# Patient Record
Sex: Male | Born: 1975 | Race: Black or African American | Hispanic: No | Marital: Married | Smoking: Never smoker
Health system: Southern US, Community
[De-identification: ages and names within clinical notes are randomized; demographics above are authoritative.]

## PROBLEM LIST (undated history)

## (undated) DIAGNOSIS — R110 Nausea: Secondary | ICD-10-CM

## (undated) DIAGNOSIS — R42 Dizziness and giddiness: Secondary | ICD-10-CM

## (undated) DIAGNOSIS — E7800 Pure hypercholesterolemia, unspecified: Secondary | ICD-10-CM

## (undated) DIAGNOSIS — I10 Essential (primary) hypertension: Secondary | ICD-10-CM

## (undated) DIAGNOSIS — E119 Type 2 diabetes mellitus without complications: Secondary | ICD-10-CM

## (undated) HISTORY — DX: Dizziness and giddiness: R42

## (undated) HISTORY — DX: Nausea: R11.0

---

## 2008-01-03 ENCOUNTER — Emergency Department: Payer: Self-pay | Admitting: Internal Medicine

## 2011-02-18 ENCOUNTER — Emergency Department: Payer: Self-pay | Admitting: Emergency Medicine

## 2012-03-26 ENCOUNTER — Emergency Department: Payer: Self-pay | Admitting: Emergency Medicine

## 2012-03-26 LAB — COMPREHENSIVE METABOLIC PANEL
Albumin: 4.1 g/dL (ref 3.4–5.0)
Anion Gap: 9 (ref 7–16)
Calcium, Total: 8.6 mg/dL (ref 8.5–10.1)
Co2: 27 mmol/L (ref 21–32)
Creatinine: 0.95 mg/dL (ref 0.60–1.30)
EGFR (Non-African Amer.): >60
Glucose: 100 mg/dL — ABNORMAL HIGH (ref 65–99)
Potassium: 4.1 mmol/L (ref 3.5–5.1)
SGOT(AST): 30 U/L (ref 15–37)

## 2012-03-26 LAB — CBC
HGB: 13.8 g/dL (ref 13.0–18.0)
MCH: 30.6 pg (ref 26.0–34.0)
MCHC: 32.7 g/dL (ref 32.0–36.0)
RDW: 12.9 % (ref 11.5–14.5)

## 2012-03-26 LAB — CK TOTAL AND CKMB (NOT AT ARMC): CK-MB: 1.8 ng/mL (ref 0.5–3.6)

## 2013-08-10 ENCOUNTER — Emergency Department: Payer: Self-pay | Admitting: Emergency Medicine

## 2022-05-22 ENCOUNTER — Other Ambulatory Visit: Payer: Self-pay

## 2022-05-22 ENCOUNTER — Emergency Department (HOSPITAL_COMMUNITY): Payer: BC Managed Care – PPO

## 2022-05-22 ENCOUNTER — Encounter (HOSPITAL_COMMUNITY): Payer: Self-pay

## 2022-05-22 ENCOUNTER — Emergency Department (HOSPITAL_COMMUNITY)
Admission: EM | Admit: 2022-05-22 | Discharge: 2022-05-22 | Disposition: A | Discharge status: Home / Self Care | Payer: BC Managed Care – PPO | Attending: Emergency Medicine | Admitting: Emergency Medicine

## 2022-05-22 DIAGNOSIS — R2241 Localized swelling, mass and lump, right lower limb: Secondary | ICD-10-CM | POA: Diagnosis not present

## 2022-05-22 DIAGNOSIS — E119 Type 2 diabetes mellitus without complications: Secondary | ICD-10-CM | POA: Diagnosis not present

## 2022-05-22 DIAGNOSIS — I10 Essential (primary) hypertension: Secondary | ICD-10-CM | POA: Diagnosis not present

## 2022-05-22 DIAGNOSIS — M25561 Pain in right knee: Secondary | ICD-10-CM | POA: Diagnosis present

## 2022-05-22 HISTORY — DX: Pure hypercholesterolemia, unspecified: E78.00

## 2022-05-22 HISTORY — DX: Essential (primary) hypertension: I10

## 2022-05-22 HISTORY — DX: Type 2 diabetes mellitus without complications: E11.9

## 2022-05-22 MED ORDER — OXYCODONE-ACETAMINOPHEN 5-325 MG PO TABS
1.0000 | ORAL_TABLET | Freq: Once | ORAL | Status: AC
  Administered 2022-05-22: 1 via ORAL
  Filled 2022-05-22: qty 1

## 2022-05-22 MED ORDER — LIDOCAINE 5 % EX PTCH
1.0000 | MEDICATED_PATCH | CUTANEOUS | Status: DC
  Administered 2022-05-22: 1 via TRANSDERMAL
  Filled 2022-05-22: qty 1

## 2022-05-22 MED ORDER — NAPROXEN 500 MG PO TABS
500.0000 mg | ORAL_TABLET | Freq: Once | ORAL | Status: AC
  Administered 2022-05-22: 500 mg via ORAL
  Filled 2022-05-22: qty 1

## 2022-05-22 NOTE — ED Triage Notes (Signed)
Patient c/o right knee pain and swelling when he woke yesterday AM. Patient denies any injury.

## 2022-05-22 NOTE — Discharge Instructions (Addendum)
You were seen in the ER for evaluation of your right knee pain. Your x-ray showed arthritis, otherwise was unremarkable. You can take naproxen or ibuprofen 600mg  as needed for pain. You can also try OTC lidocaine patches as well to the area. Additionally, I have included the information on the RICE method as well.  I have included the information for a orthopedic provider to this discharge paperwork for you to follow up with. If you have any concern, new or worsening symptoms, please return to the ER for re-evaluation.   Contact a doctor if: The knee pain does not stop. The knee pain changes or gets worse. You have a fever along with knee pain. Your knee is red or feels warm when you touch it. Your knee gives out or locks up. Get help right away if: Your knee swells, and the swelling gets worse. You cannot move your knee. You have very bad knee pain that does not get better with pain medicine.

## 2022-05-22 NOTE — Progress Notes (Signed)
Orthopedic Tech Progress Note Patient Details:  Matthew Ford 1976-03-10 [id number]  Ortho Devices Type of Ortho Device: Crutches Ortho Device/Splint Interventions: Adjustment   Post Interventions Patient Tolerated: Well, Ambulated well Instructions Provided: Care of device, Adjustment of device, Poper ambulation with device  [name] 05/22/2022, 6:04 PM

## 2022-05-22 NOTE — ED Provider Notes (Signed)
[facility]-EMERGENCY DEPT Provider Note   CSN: [id number] Arrival date & time: 05/22/22  1348     History Chief Complaint  Patient presents with   Knee Pain    Prudence Davidson. is a 46 y.o. male HTN, DM, and HLD presents to the ED for evaluation right knee pain onset yesterday morning. The patient denies any trauma or injury to the area. He reports the pain is mainly at the top of his right knee and he has noticed some swelling. No pain medication taken for the pain. He denies any previous surgery or injury to the knee. Denies any numbness, tingling, or fever.    Knee Pain Associated symptoms: no fever       Home Medications Prior to Admission medications   Not on File      Allergies    Patient has no known allergies.    Review of Systems   Review of Systems  Constitutional:  Negative for chills and fever.  Respiratory:  Negative for shortness of breath.   Cardiovascular:  Negative for chest pain.  Musculoskeletal:  Positive for arthralgias and joint swelling.   Physical Exam Updated Vital Signs BP (!) 155/90 (BP Location: Left Arm)   Pulse 75   Temp 98.4 F (36.9 C) (Oral)   Resp 16   Ht 5\' 11"  (1.803 m)   Wt (!) 152.9 kg   SpO2 96%   BMI 47.00 kg/m  Physical Exam Constitutional:      Appearance: Normal appearance. He is obese.  Eyes:     General: No scleral icterus. Pulmonary:     Effort: Pulmonary effort is normal. No respiratory distress.  Musculoskeletal:        General: Tenderness present.     Comments: Right knee pain on ROM, but he is able to actively flex and extend the knee. Compartments are soft. Cap refill < 2seconds. Palpable DP and PT pulses. Sensation intact bilaterally. The patient does have some crepitus palpated superiorly upon flexing and extending the knee. Mild swelling noted to the upper aspect of the right knee. Pain is noted to the mid superior portion of the right knee. No erythema, increase in warmth, or  overlying skin changes noted. No tenderness in the popliteal region. No lower leg swelling. No palpable step off or deformities.   Skin:    General: Skin is dry.     Findings: No rash.  Neurological:     General: No focal deficit present.     Mental Status: He is alert. Mental status is at baseline.  Psychiatric:        Mood and Affect: Mood normal.    ED Results / Procedures / Treatments   Labs (all labs ordered are listed, but only abnormal results are displayed) Labs Reviewed - No data to display  EKG None  Radiology DG Knee Complete 4 Views Right  Result Date: 05/22/2022 CLINICAL DATA:  Right knee pain and swelling.  No known injury. EXAM: RIGHT KNEE - COMPLETE 4+ VIEW COMPARISON:  None Available. FINDINGS: Normal bone mineralization. No joint effusion. Mild chronic enthesopathic change at the quadriceps insertion on the patella. Joint spaces are preserved. No acute fracture is seen. No dislocation. IMPRESSION: 1. Mild enthesopathic change at the quadriceps insertion on the patella. 2. No significant osteoarthritis. Electronically Signed   By: 07/22/2022 M.D.   On: 05/22/2022 14:29    Procedures Procedures   Medications Ordered in ED Medications  lidocaine (LIDODERM) 5 % 1  patch (has no administration in time range)  naproxen (NAPROSYN) tablet 500 mg (has no administration in time range)    ED Course/ Medical Decision Making/ A&P                           Medical Decision Making Amount and/or Complexity of Data Reviewed Radiology: ordered.  Risk Prescription drug management.    46 year old male presents the emergency department for evaluation of right knee pain since yesterday.  Differential diagnosis includes was not limited to arthritis, MSK, sprain, strain, dislocation, septic arthritis, joint effusion.  Vital signs show mildly low blood pressure 135/90, afebrile, normal pulse rate, satting well room air without increased work of breathing.  Physical exam shows a  well-appearing male in no acute distress. Right knee pain on ROM, but he is able to actively flex and extend the knee. Compartments are soft. Cap refill < 2seconds. Palpable DP and PT pulses. Sensation intact bilaterally. The patient does have some crepitus palpated superiorly upon flexing and extending the knee. Mild swelling noted to the upper aspect. No erythema, increase in warmth, or overlying skin changes noted. No tenderness in the popliteal region. No lower leg swelling. No palpable step off or deformities.    X-ray shows mild enthesopathic change of the quadriceps insertion on the patella.  No significant osteoarthritis.  No injury or trauma to the area so a doubt any fracture. The patient is obese, so concern for arthritis, joint disease, or stress fracture. No fracture visualized on XR and the joint spaces are reportedly preserved. The patient is able to flex and extend his knee with some pain and his vitals are stable with no sign of fever or infection. Doubt septic arthritis at this time. There is no tappable fluid to remove.   Will give first does of naprosyn for his pain as well as a lidocaine patch and ACE bandage.  Patient was still having pain with weight-bearing so we did use crutches which the patient is ambulatory with. I also ordered him a Percocet as well.   Will give Orthopedic referral. Dicussed using topical lidocaine patches as well as taking naprosyn for pain. Reviewed the RICE method. We discussed strict return precautions and reg flag symptoms. The patient verbalized understanding and agrees to the plan. The patient is stable and being discharged home in good condition.   Final Clinical Impression(s) / ED Diagnoses Final diagnoses:  Acute pain of right knee    Rx / DC Orders ED Discharge Orders     None         Achille Rich, PA-C 05/22/22 1712    Wynetta Fines, MD 05/23/22 1047

## 2022-12-01 ENCOUNTER — Other Ambulatory Visit (HOSPITAL_COMMUNITY): Payer: Self-pay

## 2022-12-01 MED ORDER — OZEMPIC (2 MG/DOSE) 8 MG/3ML ~~LOC~~ SOPN
2.0000 mg | PEN_INJECTOR | SUBCUTANEOUS | 0 refills | Status: DC
  Filled 2022-12-01: qty 3, 28d supply, fill #0

## 2023-01-01 ENCOUNTER — Other Ambulatory Visit (HOSPITAL_COMMUNITY): Payer: Self-pay

## 2023-01-01 MED ORDER — OZEMPIC (2 MG/DOSE) 8 MG/3ML ~~LOC~~ SOPN
2.0000 mg | PEN_INJECTOR | SUBCUTANEOUS | 0 refills | Status: AC
Start: 2023-01-01 — End: ?
  Filled 2023-01-01: qty 3, 28d supply, fill #0

## 2023-01-08 ENCOUNTER — Other Ambulatory Visit (HOSPITAL_COMMUNITY): Payer: Self-pay

## 2023-01-08 MED ORDER — OZEMPIC (2 MG/DOSE) 8 MG/3ML ~~LOC~~ SOPN
2.0000 mg | PEN_INJECTOR | SUBCUTANEOUS | 0 refills | Status: AC
Start: 2023-01-05 — End: ?

## 2023-02-07 ENCOUNTER — Other Ambulatory Visit (HOSPITAL_COMMUNITY): Payer: Self-pay

## 2023-02-07 MED ORDER — OZEMPIC (2 MG/DOSE) 8 MG/3ML ~~LOC~~ SOPN
2.0000 mg | PEN_INJECTOR | SUBCUTANEOUS | 0 refills | Status: AC
Start: 2023-02-06 — End: ?
  Filled 2023-02-07: qty 3, 28d supply, fill #0

## 2023-02-14 ENCOUNTER — Other Ambulatory Visit (HOSPITAL_COMMUNITY): Payer: Self-pay

## 2023-03-08 ENCOUNTER — Other Ambulatory Visit (HOSPITAL_COMMUNITY): Payer: Self-pay

## 2023-03-08 MED ORDER — OZEMPIC (2 MG/DOSE) 8 MG/3ML ~~LOC~~ SOPN
2.0000 mg | PEN_INJECTOR | SUBCUTANEOUS | 0 refills | Status: AC
Start: 2023-03-08 — End: ?
  Filled 2023-03-08 – 2023-03-26 (×2): qty 3, 28d supply, fill #0

## 2023-03-23 ENCOUNTER — Other Ambulatory Visit (HOSPITAL_COMMUNITY): Payer: Self-pay

## 2023-03-26 ENCOUNTER — Other Ambulatory Visit (HOSPITAL_COMMUNITY): Payer: Self-pay

## 2023-04-09 ENCOUNTER — Other Ambulatory Visit (HOSPITAL_COMMUNITY): Payer: Self-pay

## 2023-04-09 MED ORDER — VITAMIN D (ERGOCALCIFEROL) 1.25 MG (50000 UNIT) PO CAPS
50000.0000 [IU] | ORAL_CAPSULE | ORAL | 1 refills | Status: AC
  Filled 2023-04-09: qty 13, 90d supply, fill #0

## 2023-04-09 MED ORDER — OZEMPIC (2 MG/DOSE) 8 MG/3ML ~~LOC~~ SOPN
2.0000 mg | PEN_INJECTOR | SUBCUTANEOUS | 0 refills | Status: AC
Start: 2023-04-09 — End: ?
  Filled 2023-04-09 – 2023-04-30 (×2): qty 3, 28d supply, fill #0

## 2023-04-10 ENCOUNTER — Other Ambulatory Visit: Payer: Self-pay

## 2023-04-10 ENCOUNTER — Other Ambulatory Visit (HOSPITAL_COMMUNITY): Payer: Self-pay

## 2023-04-13 ENCOUNTER — Other Ambulatory Visit (HOSPITAL_COMMUNITY): Payer: Self-pay

## 2023-04-30 ENCOUNTER — Other Ambulatory Visit (HOSPITAL_COMMUNITY): Payer: Self-pay

## 2023-05-15 ENCOUNTER — Other Ambulatory Visit (HOSPITAL_COMMUNITY): Payer: Self-pay

## 2023-05-15 MED ORDER — OZEMPIC (2 MG/DOSE) 8 MG/3ML ~~LOC~~ SOPN
2.0000 mg | PEN_INJECTOR | SUBCUTANEOUS | 0 refills | Status: AC
Start: 2023-05-15 — End: ?
  Filled 2023-05-15 – 2023-07-10 (×2): qty 3, 28d supply, fill #0

## 2023-06-18 ENCOUNTER — Other Ambulatory Visit (HOSPITAL_COMMUNITY): Payer: Self-pay

## 2023-06-18 MED ORDER — OZEMPIC (2 MG/DOSE) 8 MG/3ML ~~LOC~~ SOPN
2.0000 mg | PEN_INJECTOR | SUBCUTANEOUS | 0 refills | Status: AC
Start: 2023-06-16 — End: ?
  Filled 2023-06-18: qty 3, 28d supply, fill #0

## 2023-07-10 ENCOUNTER — Other Ambulatory Visit (HOSPITAL_COMMUNITY): Payer: Self-pay

## 2023-07-11 ENCOUNTER — Other Ambulatory Visit (HOSPITAL_COMMUNITY): Payer: Self-pay

## 2023-07-16 ENCOUNTER — Other Ambulatory Visit (HOSPITAL_COMMUNITY): Payer: Self-pay

## 2023-07-16 MED ORDER — MOUNJARO 2.5 MG/0.5ML ~~LOC~~ SOAJ
2.5000 mg | SUBCUTANEOUS | 0 refills | Status: DC
  Filled 2023-07-16: qty 2, 28d supply, fill #0

## 2023-07-30 ENCOUNTER — Other Ambulatory Visit (HOSPITAL_COMMUNITY): Payer: Self-pay

## 2023-08-13 ENCOUNTER — Other Ambulatory Visit (HOSPITAL_COMMUNITY): Payer: Self-pay

## 2023-08-13 MED ORDER — VITAMIN D (ERGOCALCIFEROL) 1.25 MG (50000 UNIT) PO CAPS
50000.0000 [IU] | ORAL_CAPSULE | ORAL | 3 refills | Status: AC
  Filled 2023-08-13 – 2023-08-31 (×2): qty 12, 84d supply, fill #0

## 2023-08-13 MED ORDER — MOUNJARO 5 MG/0.5ML ~~LOC~~ SOAJ
5.0000 mg | SUBCUTANEOUS | 0 refills | Status: AC
  Filled 2023-08-13 – 2023-09-14 (×2): qty 2, 28d supply, fill #0

## 2023-08-29 IMAGING — DX DG KNEE COMPLETE 4+V*R*
4 series · 4 of 4 positions shown · non-contrast
Comparison: None Available.

CLINICAL DATA: Right knee pain and swelling.  No known injury.

EXAM:
RIGHT KNEE - COMPLETE 4+ VIEW

[knee ap]
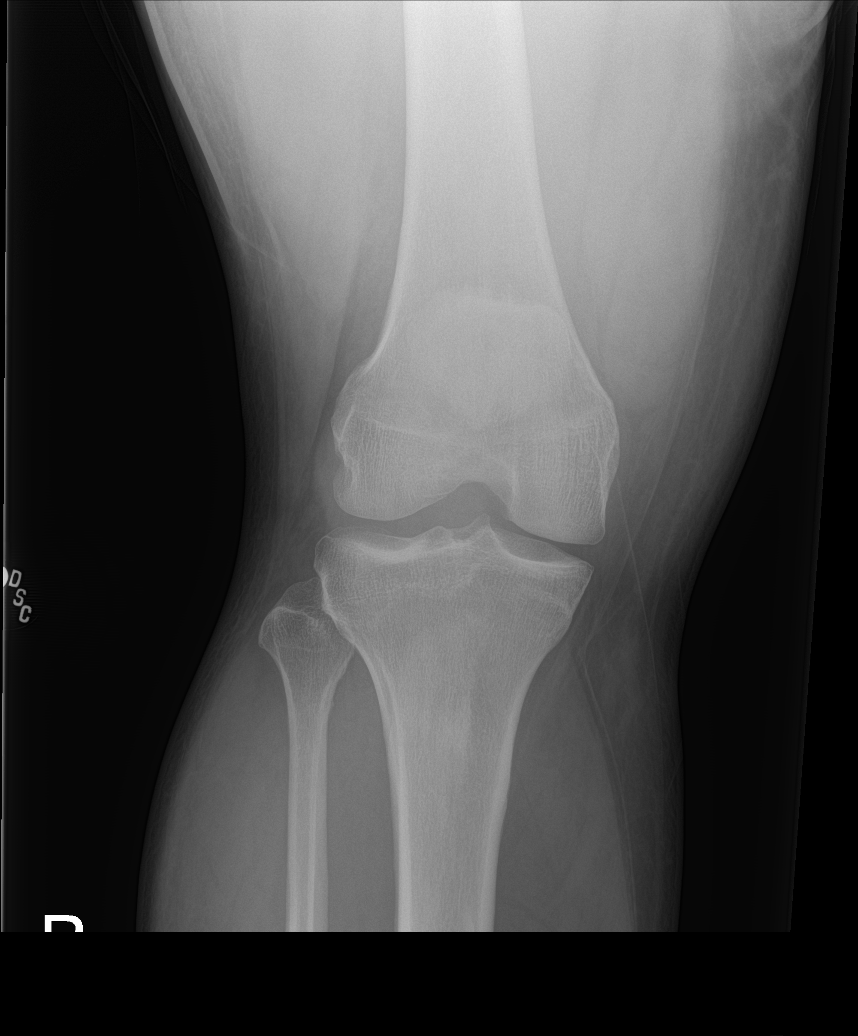

[knee obl (1 of 2)]
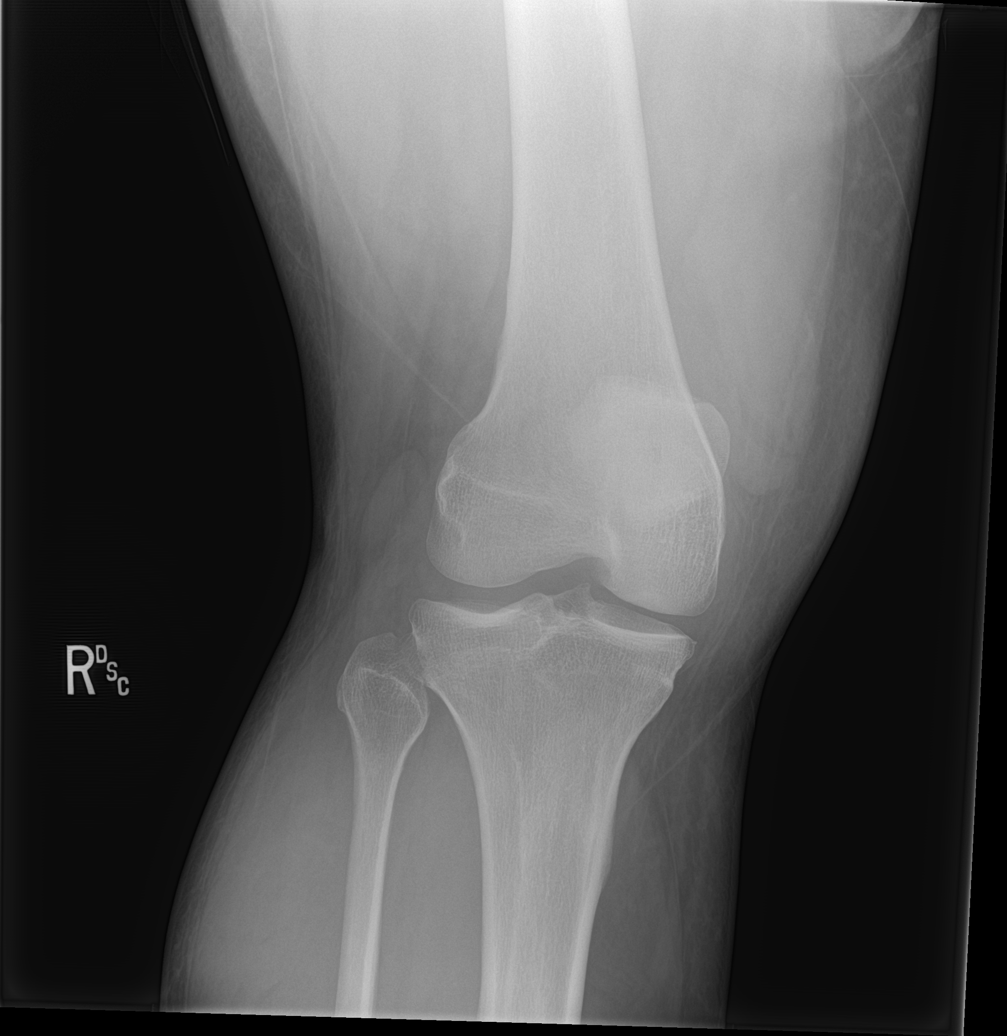

[knee lat]
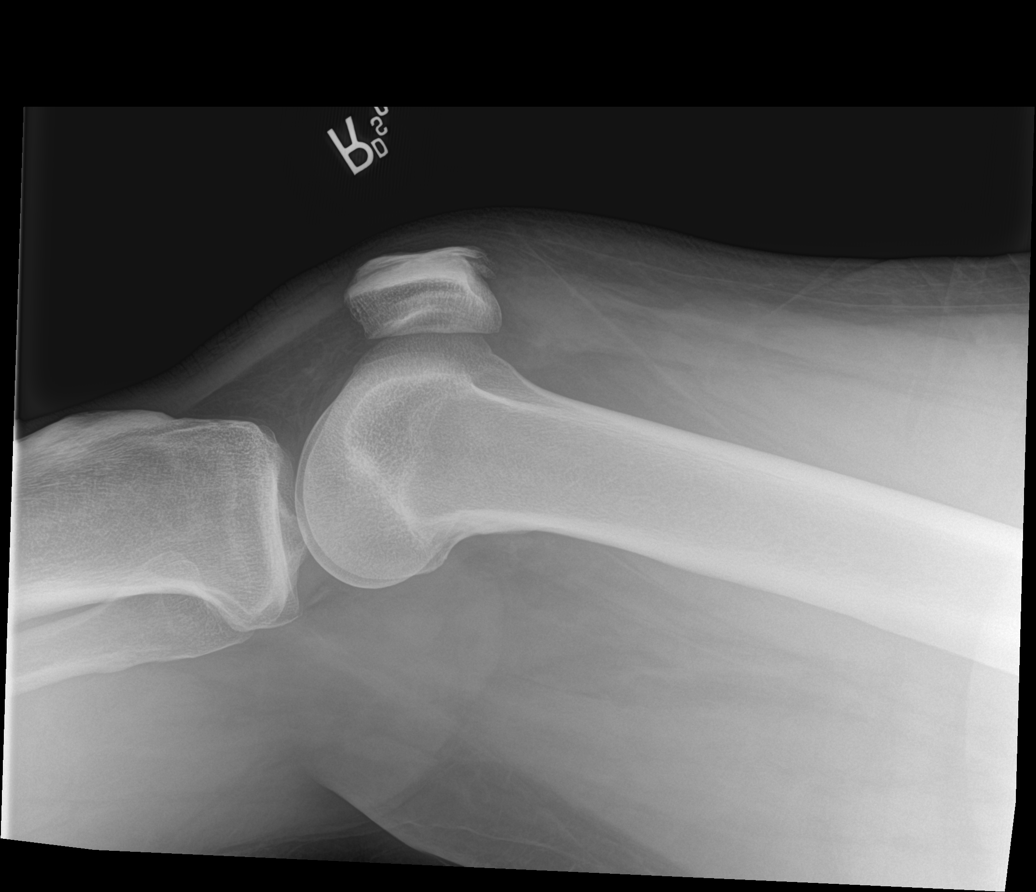

[knee obl (2 of 2)]
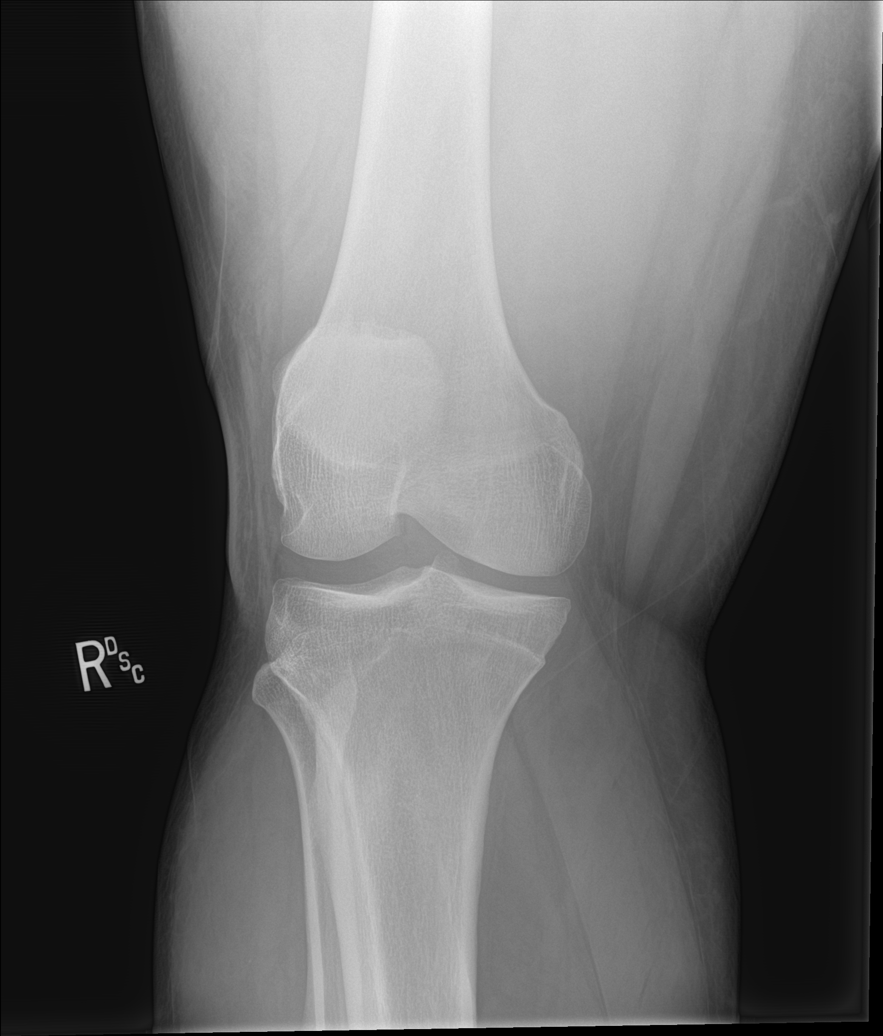

[4 of 4 positions shown; findings below may reference images not displayed]

FINDINGS: Normal bone mineralization. No joint effusion. Mild chronic
enthesopathic change at the quadriceps insertion on the patella.
Joint spaces are preserved. No acute fracture is seen. No
dislocation.
IMPRESSION: 1. Mild enthesopathic change at the quadriceps insertion on the
patella.
2. No significant osteoarthritis.

## 2023-08-31 ENCOUNTER — Other Ambulatory Visit (HOSPITAL_COMMUNITY): Payer: Self-pay

## 2023-09-03 ENCOUNTER — Other Ambulatory Visit (HOSPITAL_COMMUNITY): Payer: Self-pay

## 2023-09-04 ENCOUNTER — Other Ambulatory Visit (HOSPITAL_COMMUNITY): Payer: Self-pay

## 2023-09-07 ENCOUNTER — Other Ambulatory Visit (HOSPITAL_COMMUNITY): Payer: Self-pay

## 2023-09-10 ENCOUNTER — Other Ambulatory Visit (HOSPITAL_COMMUNITY): Payer: Self-pay

## 2023-09-10 MED ORDER — MOUNJARO 5 MG/0.5ML ~~LOC~~ SOAJ
5.0000 mg | SUBCUTANEOUS | 0 refills | Status: AC
  Filled 2023-09-10: qty 2, 28d supply, fill #0

## 2023-09-14 ENCOUNTER — Other Ambulatory Visit (HOSPITAL_COMMUNITY): Payer: Self-pay

## 2023-09-27 ENCOUNTER — Other Ambulatory Visit (HOSPITAL_COMMUNITY): Payer: Self-pay

## 2023-09-27 ENCOUNTER — Other Ambulatory Visit: Payer: Self-pay

## 2023-09-27 ENCOUNTER — Emergency Department (HOSPITAL_COMMUNITY)
Admission: EM | Admit: 2023-09-27 | Discharge: 2023-09-27 | Disposition: A | Discharge status: Home / Self Care | Payer: BC Managed Care – PPO | Attending: Emergency Medicine | Admitting: Emergency Medicine

## 2023-09-27 ENCOUNTER — Encounter (HOSPITAL_COMMUNITY): Payer: Self-pay

## 2023-09-27 DIAGNOSIS — I10 Essential (primary) hypertension: Secondary | ICD-10-CM | POA: Diagnosis not present

## 2023-09-27 DIAGNOSIS — R110 Nausea: Secondary | ICD-10-CM | POA: Diagnosis not present

## 2023-09-27 DIAGNOSIS — R42 Dizziness and giddiness: Secondary | ICD-10-CM | POA: Diagnosis present

## 2023-09-27 DIAGNOSIS — E119 Type 2 diabetes mellitus without complications: Secondary | ICD-10-CM | POA: Diagnosis not present

## 2023-09-27 DIAGNOSIS — R001 Bradycardia, unspecified: Secondary | ICD-10-CM | POA: Diagnosis not present

## 2023-09-27 LAB — COMPREHENSIVE METABOLIC PANEL
ALT: 26 U/L (ref 0–44)
AST: 25 U/L (ref 15–41)
Albumin: 3.8 g/dL (ref 3.5–5.0)
Alkaline Phosphatase: 42 U/L (ref 38–126)
Anion gap: 13 (ref 5–15)
BUN: 9 mg/dL (ref 6–20)
CO2: 21 mmol/L — ABNORMAL LOW (ref 22–32)
Calcium: 9.5 mg/dL (ref 8.9–10.3)
Chloride: 104 mmol/L (ref 98–111)
Creatinine, Ser: 1.14 mg/dL (ref 0.61–1.24)
GFR, Estimated: >60 mL/min (ref >60)
Glucose, Bld: 108 mg/dL — ABNORMAL HIGH (ref 70–99)
Potassium: 4.2 mmol/L (ref 3.5–5.1)
Sodium: 138 mmol/L (ref 135–145)
Total Bilirubin: 0.5 mg/dL (ref 0.3–1.2)
Total Protein: 7.1 g/dL (ref 6.5–8.1)

## 2023-09-27 LAB — CBC WITH DIFFERENTIAL/PLATELET
Abs Immature Granulocytes: 0.02 10*3/uL (ref 0.00–0.07)
Basophils Absolute: 0 10*3/uL (ref 0.0–0.1)
Basophils Relative: 1 %
Eosinophils Absolute: 0.1 10*3/uL (ref 0.0–0.5)
Eosinophils Relative: 1 %
HCT: 42 % (ref 39.0–52.0)
Hemoglobin: 13.9 g/dL (ref 13.0–17.0)
Immature Granulocytes: 0 %
Lymphocytes Relative: 21 %
Lymphs Abs: 1.5 10*3/uL (ref 0.7–4.0)
MCH: 29.8 pg (ref 26.0–34.0)
MCHC: 33.1 g/dL (ref 30.0–36.0)
MCV: 89.9 fL (ref 80.0–100.0)
Monocytes Absolute: 0.5 10*3/uL (ref 0.1–1.0)
Monocytes Relative: 7 %
Neutro Abs: 4.8 10*3/uL (ref 1.7–7.7)
Neutrophils Relative %: 70 %
Platelets: 269 10*3/uL (ref 150–400)
RBC: 4.67 MIL/uL (ref 4.22–5.81)
RDW: 13.9 % (ref 11.5–15.5)
WBC: 7 10*3/uL (ref 4.0–10.5)
nRBC: 0 % (ref 0.0–0.2)

## 2023-09-27 LAB — URINALYSIS, ROUTINE W REFLEX MICROSCOPIC
Bilirubin Urine: NEGATIVE
Glucose, UA: NEGATIVE mg/dL
Hgb urine dipstick: NEGATIVE
Ketones, ur: NEGATIVE mg/dL
Leukocytes,Ua: NEGATIVE
Nitrite: NEGATIVE
Protein, ur: NEGATIVE mg/dL
Specific Gravity, Urine: 1.015 (ref 1.005–1.030)
pH: 7 (ref 5.0–8.0)

## 2023-09-27 LAB — LIPASE, BLOOD: Lipase: 133 U/L — ABNORMAL HIGH (ref 11–51)

## 2023-09-27 LAB — TROPONIN I (HIGH SENSITIVITY): Troponin I (High Sensitivity): 5 ng/L (ref <18)

## 2023-09-27 LAB — CBG MONITORING, ED: Glucose-Capillary: 116 mg/dL — ABNORMAL HIGH (ref 70–99)

## 2023-09-27 MED ORDER — MECLIZINE HCL 25 MG PO TABS
25.0000 mg | ORAL_TABLET | Freq: Once | ORAL | Status: AC
  Administered 2023-09-27: 25 mg via ORAL
  Filled 2023-09-27: qty 1

## 2023-09-27 MED ORDER — MECLIZINE HCL 25 MG PO TABS
25.0000 mg | ORAL_TABLET | Freq: Three times a day (TID) | ORAL | 0 refills | Status: AC | PRN
Start: 2023-09-27 — End: ?
  Filled 2023-09-27: qty 30, 10d supply, fill #0

## 2023-09-27 MED ORDER — ONDANSETRON 4 MG PO TBDP
4.0000 mg | ORAL_TABLET | Freq: Once | ORAL | Status: AC | PRN
  Administered 2023-09-27: 4 mg via ORAL
  Filled 2023-09-27: qty 1

## 2023-09-27 NOTE — ED Provider Triage Note (Signed)
Emergency Medicine Provider Triage Evaluation Note  Matthew Ford , a 46 y.o. male  was evaluated in triage.  Pt complains of dizziness and nausea.  He reports he feels like the room is spinning.  Denies diarrhea, vomiting, fever.  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    Review of Systems  Positive: As above Negative: As above  Physical Exam  BP (!) 160/106 (BP Location: Right Arm)   Pulse (!) 55   Temp (!) 97.5 F (36.4 C)   Resp 19   Ht 5' 10.5" (1.791 m)   Wt (!) 152 kg   SpO2 96%   BMI 47.39 kg/m  Gen:   Awake, no distress   Resp:  Normal effort  MSK:   Moves extremities without difficulty  Other:    Medical Decision Making  Medically screening exam initiated at 10:39 AM.  Appropriate orders placed.  Matthew Ford. was informed that the remainder of the evaluation will be completed by another provider, this initial triage assessment does not replace that evaluation, and the importance of remaining in the ED until their evaluation is complete.     Melton Alar R, PA-C 09/27/23 1039

## 2023-09-27 NOTE — ED Provider Notes (Signed)
[place] EMERGENCY DEPARTMENT AT [facility] Provider Note   CSN: [id number] Arrival date & time: 09/27/23  1026     History  Chief Complaint  Patient presents with   Dizziness   Nausea    Matthew Gibbs. is a 47 y.o. male with past medical history of hypertension, hyperlipidemia and diabetes presenting to the emergency room with dizziness that has been ongoing since 7am and associated with nausea.  Patient reports dizziness has improved.  Dizziness is worse with standing and walking, dizziness is better with sitting and lying down.  Dizziness is described as the room is spinning.  Not associated with headache, neck pain, focal neurodeficit, or change in vision.  Denies chest pain, shortness of breath, fevers, chills.    Dizziness      Home Medications Prior to Admission medications   Medication Sig Start Date End Date Taking? Authorizing Provider  Semaglutide, 2 MG/DOSE, (OZEMPIC, 2 MG/DOSE,) 8 MG/3ML SOPN Inject 2 mg into the skin once a week. 01/01/23     Semaglutide, 2 MG/DOSE, (OZEMPIC, 2 MG/DOSE,) 8 MG/3ML SOPN Inject 2 mg into the skin once a week. 01/05/23     Semaglutide, 2 MG/DOSE, (OZEMPIC, 2 MG/DOSE,) 8 MG/3ML SOPN Inject 2 mg into the skin once a week. 02/06/23     Semaglutide, 2 MG/DOSE, (OZEMPIC, 2 MG/DOSE,) 8 MG/3ML SOPN Inject 2 mg into the skin once a week. 03/08/23     Semaglutide, 2 MG/DOSE, (OZEMPIC, 2 MG/DOSE,) 8 MG/3ML SOPN Inject 2 mg into the skin once a week. 04/09/23     Semaglutide, 2 MG/DOSE, (OZEMPIC, 2 MG/DOSE,) 8 MG/3ML SOPN Inject 2 mg into the skin once a week. 05/15/23     Semaglutide, 2 MG/DOSE, (OZEMPIC, 2 MG/DOSE,) 8 MG/3ML SOPN Inject 2 mg into the skin once a week. 06/16/23     tirzepatide (MOUNJARO) 2.5 MG/0.5ML Pen Inject 2.5 mg into the skin once a week for 4 weeks 07/16/23     tirzepatide [facility]) 5 MG/0.5ML Pen Inject 5 mg into the skin once a week. 08/12/23     tirzepatide (MOUNJARO) 5 MG/0.5ML Pen Inject 5 mg into the skin  once a week. 09/08/23     Vitamin D, Ergocalciferol, (DRISDOL) 1.25 MG (50000 UNIT) CAPS capsule Take 1 capsule (50,000 Units total) by mouth once a week. 04/09/23     Vitamin D, Ergocalciferol, (DRISDOL) 1.25 MG (50000 UNIT) CAPS capsule Take 1 capsule (50,000 Units total) by mouth once a week. 08/12/23         Allergies    Patient has no known allergies.    Review of Systems   Review of Systems  Neurological:  Positive for dizziness.    Physical Exam Updated Vital Signs BP 132/85 (BP Location: Right Arm)   Pulse (!) 53   Temp 97.9 F (36.6 C) (Oral)   Resp 18   Ht 5' 10.5" (1.791 m)   Wt (!) 152 kg   SpO2 96%   BMI 47.39 kg/m  Physical Exam Vitals and nursing note reviewed.  Constitutional:      General: He is not in acute distress.    Appearance: He is not ill-appearing, toxic-appearing or diaphoretic.  HENT:     Head: Normocephalic and atraumatic.  Eyes:     General: No scleral icterus.    Extraocular Movements: Extraocular movements intact.     Conjunctiva/sclera: Conjunctivae normal.     Pupils: Pupils are equal, round, and reactive to light.     Comments:  No nystagmus on exam.  Sensation is equal bilaterally, equal strength bilaterally of lower and upper extremity.  No cranial nerve deficit.  Cardiovascular:     Rate and Rhythm: Regular rhythm. Bradycardia present.     Pulses: Normal pulses.     Heart sounds: Normal heart sounds. No murmur heard. Pulmonary:     Effort: Pulmonary effort is normal. No respiratory distress.     Breath sounds: Normal breath sounds. No stridor. No wheezing or rales.  Chest:     Chest wall: No tenderness.  Abdominal:     General: Abdomen is flat. Bowel sounds are normal. There is no distension.     Palpations: Abdomen is soft.     Tenderness: There is no abdominal tenderness.  Musculoskeletal:     Cervical back: No rigidity or tenderness.     Right lower leg: No edema.     Left lower leg: No edema.  Skin:    General: Skin is warm  and dry.     Capillary Refill: Capillary refill takes less than 2 seconds.     Findings: No lesion.  Neurological:     General: No focal deficit present.     Mental Status: He is alert and oriented to person, place, and time. Mental status is at baseline.     Cranial Nerves: No cranial nerve deficit.     Sensory: No sensory deficit.     Motor: No weakness.     ED Results / Procedures / Treatments   Labs (all labs ordered are listed, but only abnormal results are displayed) Labs Reviewed  LIPASE, BLOOD - Abnormal; Notable for the following components:      Result Value   Lipase 133 (*)    All other components within normal limits  COMPREHENSIVE METABOLIC PANEL - Abnormal; Notable for the following components:   CO2 21 (*)    Glucose, Bld 108 (*)    All other components within normal limits  CBG MONITORING, ED - Abnormal; Notable for the following components:   Glucose-Capillary 116 (*)    All other components within normal limits  CBC WITH DIFFERENTIAL/PLATELET  URINALYSIS, ROUTINE W REFLEX MICROSCOPIC  TROPONIN I (HIGH SENSITIVITY)    EKG EKG Interpretation Date/Time:  Thursday September 27 2023 09:59:55 EDT Ventricular Rate:  48 PR Interval:  272 QRS Duration:  98 QT Interval:  430 QTC Calculation: 384 R Axis:   -2  Text Interpretation: Sinus bradycardia with 1st degree A-V block Minimal voltage criteria for LVH, may be normal variant ( R in aVL ) Septal infarct , age undetermined Abnormal ECG When compared with ECG of 26-Mar-2012 06:23, No significant change since last tracing Confirmed by Alvira Monday ([id number]) on 09/27/2023 2:22:24 PM  Radiology No results found.  Procedures Procedures    Medications Ordered in ED Medications  ondansetron (ZOFRAN-ODT) disintegrating tablet 4 mg (4 mg Oral Given 09/27/23 1059)    ED Course/ Medical Decision Making/ A&P Clinical Course as of 09/27/23 1633  Thu Sep 27, 2023  1630 RN at bedside doing orthostatic  vitals Patient ambulated with steady gate  [JB]    Clinical Course User Index [JB] Tabathia Knoche, Horald Chestnut, PA-C                                 Medical Decision Making  Matthew Ford. 47 y.o. presented today for dizziness. Working DDx that I considered at this time includes, but  not limited to, CVA/TIA, arrhythmia, vertigo, medication s/e, orthostatic hypotension, electrolyte abnormalities, dehydration, URI, ACS, UTI, anemia   R/o DDx: These are considered less likely than current impression due to history of present illness, physical exam, lab/imaging findings   Review of prior external notes: None  Pmhx: HLD, HTN  Unique Tests and My Interpretation:  CBC with differential: unremarkable, no increase WBC count, no anemia  CMP: Overall unremarkable, glucose 108 Lipase: 133 POC BG: 116 Trop: wnl  UA: wnl    EKG: Rate, rhythm, axis, intervals all examined: 1st degree AV block   Orthostatic vitals: NEG Ambulates with steady gate   Imaging: None    Problem List / ED Course / Critical interventions / Medication management  Patient reporting to the emergency room for dizziness.  Patient reports his dizziness improves with rest, is worse with walking.  Patient reports steady gait.  Patient has no headache or focal neurological symptoms.  On exam patient has normal ENT exam including equal and reactive pupils with no nystagmus.  Patient's symptoms improved after meclizine however still experiencing mild dizziness.  Overall reassuring labs, negative troponin.  Given patient hemodynamically stable and improved symptoms.  Will have patient follow-up with primary care for vertigo and prescribed meclizine.  Patient has been trying to get a cardiologist referral from primary care however has not scheduled cardiology appointment yet so I have sent cardiology follow-up for first-degree AV block bradycardia. I ordered medication including Meclizine for dizziness   Reevaluation of the patient  after these medicines showed that the patient improved Patients vitals assessed. Upon arrival patient is hemodynamically stable, slightly bradycardic with 1st degree AV block.  Patient reports that his last primary care visit approximately 3 weeks ago his PCP told him he was bradycardic and had recommended following up with cardiology, patient was not able to set up cardiology appointment yet. I have reviewed the patients home medicines and have made adjustments as needed  Consult: None   Plan: Cardiology f/u for first-degree AV block bradycardia F/u w/ PCP in 2-3d to ensure resolution of sx.  Patient was given return precautions. Patient stable for discharge at this time.  Patient educated on current sx/dx and verbalized understanding of plan. Return to ER w/ new or worsening sx.          Final Clinical Impression(s) / ED Diagnoses Final diagnoses:  None    Rx / DC Orders ED Discharge Orders     None         Smitty Knudsen, PA-C 09/27/23 1923    Loetta Rough, MD 09/29/23 [phone]

## 2023-09-27 NOTE — ED Notes (Signed)
Patient verbalizes understanding of discharge instructions. Opportunity for questioning and answers were provided. Pt discharged from ED. 

## 2023-09-27 NOTE — ED Triage Notes (Signed)
Pt bib ems coming from work. Pt started having dizziness/sweating with nausea around 7am when he woke up.  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 Pt denies headache or chest pain.

## 2023-09-27 NOTE — ED Notes (Signed)
Pt ambulatory to and from restroom with steady gait 

## 2023-09-27 NOTE — Discharge Instructions (Addendum)
You were seen in the emergency room today for dizziness.  Overall your exam is very reassuring.  I have sent a medication called meclizine to take as needed for vertigo symptoms.  I would recommend staying well-hydrated.  Please follow-up with your primary care provider as soon as possible to ensure resolution of symptoms.  You also have first-degree AV block and bradycardia on your EKG.  I referred you to cardiology, however if you do not hear back from them please call and schedule an appointment for follow-up.  Return to the emergency room with any new or worsening symptoms.

## 2023-10-15 ENCOUNTER — Other Ambulatory Visit (HOSPITAL_COMMUNITY): Payer: Self-pay

## 2023-10-15 ENCOUNTER — Other Ambulatory Visit: Payer: Self-pay

## 2023-10-15 MED ORDER — FENOFIBRATE MICRONIZED 134 MG PO CAPS
134.0000 mg | ORAL_CAPSULE | Freq: Every day | ORAL | 3 refills | Status: AC
  Filled 2023-10-15: qty 30, 30d supply, fill #0

## 2023-10-15 MED ORDER — MOUNJARO 2.5 MG/0.5ML ~~LOC~~ SOAJ: 2.5000 mg | SUBCUTANEOUS | 0 refills | Status: AC

## 2023-10-15 MED ORDER — MOUNJARO 2.5 MG/0.5ML ~~LOC~~ SOAJ
2.5000 mg | SUBCUTANEOUS | 0 refills | Status: AC
Start: 2023-10-13 — End: ?
  Filled 2023-10-15 – 2023-10-19 (×2): qty 2, 28d supply, fill #0

## 2023-10-19 ENCOUNTER — Other Ambulatory Visit (HOSPITAL_COMMUNITY): Payer: Self-pay

## 2023-11-12 ENCOUNTER — Other Ambulatory Visit (HOSPITAL_COMMUNITY): Payer: Self-pay

## 2023-11-12 MED ORDER — HYDROCHLOROTHIAZIDE 12.5 MG PO TABS
12.5000 mg | ORAL_TABLET | Freq: Every day | ORAL | 1 refills | Status: AC
  Filled 2023-11-12: qty 30, 30d supply, fill #0

## 2023-11-12 MED ORDER — ZEPBOUND 2.5 MG/0.5ML ~~LOC~~ SOAJ
2.5000 mg | SUBCUTANEOUS | 0 refills | Status: AC
  Filled 2023-11-12: qty 2, 28d supply, fill #0

## 2023-11-12 MED ORDER — ERGOCALCIFEROL 1.25 MG (50000 UT) PO CAPS
50000.0000 [IU] | ORAL_CAPSULE | ORAL | 3 refills | Status: AC
  Filled 2023-11-12: qty 12, 84d supply, fill #0

## 2023-11-14 ENCOUNTER — Other Ambulatory Visit (HOSPITAL_COMMUNITY): Payer: Self-pay

## 2023-11-29 ENCOUNTER — Ambulatory Visit: Payer: BC Managed Care – PPO | Admitting: Cardiology

## 2023-12-01 ENCOUNTER — Encounter (HOSPITAL_COMMUNITY): Payer: Self-pay | Admitting: Emergency Medicine

## 2023-12-01 ENCOUNTER — Other Ambulatory Visit: Payer: Self-pay

## 2023-12-01 ENCOUNTER — Emergency Department (HOSPITAL_COMMUNITY)
Admission: EM | Admit: 2023-12-01 | Discharge: 2023-12-01 | Disposition: A | Discharge status: Home / Self Care | Payer: BC Managed Care – PPO | Attending: Emergency Medicine | Admitting: Emergency Medicine

## 2023-12-01 DIAGNOSIS — L0211 Cutaneous abscess of neck: Secondary | ICD-10-CM | POA: Diagnosis present

## 2023-12-01 DIAGNOSIS — I10 Essential (primary) hypertension: Secondary | ICD-10-CM | POA: Insufficient documentation

## 2023-12-01 DIAGNOSIS — L0291 Cutaneous abscess, unspecified: Secondary | ICD-10-CM

## 2023-12-01 DIAGNOSIS — E119 Type 2 diabetes mellitus without complications: Secondary | ICD-10-CM | POA: Insufficient documentation

## 2023-12-01 DIAGNOSIS — Z79899 Other long term (current) drug therapy: Secondary | ICD-10-CM | POA: Insufficient documentation

## 2023-12-01 MED ORDER — SULFAMETHOXAZOLE-TRIMETHOPRIM 800-160 MG PO TABS
1.0000 | ORAL_TABLET | Freq: Two times a day (BID) | ORAL | 0 refills | Status: AC
Start: 2023-12-01 — End: 2023-12-08
  Filled 2023-12-01: qty 14, 7d supply, fill #0

## 2023-12-01 MED ORDER — SULFAMETHOXAZOLE-TRIMETHOPRIM 800-160 MG PO TABS
1.0000 | ORAL_TABLET | Freq: Once | ORAL | Status: AC
  Administered 2023-12-01: 1 via ORAL
  Filled 2023-12-01: qty 1

## 2023-12-01 MED ORDER — LIDOCAINE 5 % EX OINT
1.0000 | TOPICAL_OINTMENT | Freq: Four times a day (QID) | CUTANEOUS | 0 refills | Status: AC | PRN
  Filled 2023-12-01: qty 35.44, 10d supply, fill #0

## 2023-12-01 NOTE — ED Provider Notes (Incomplete)
[place] EMERGENCY DEPARTMENT AT [facility] Provider Note   CSN: [id number] Arrival date & time: 12/01/23  1214     History {Add pertinent medical, surgical, social history, OB history to HPI:1} No chief complaint on file.   Matthew Ford. is a 47 y.o. male with PMHx DM, HLD, HTN who presents to ED concerned for abscess on posterior left sided neck fold. States that this area is painful/tender. States that this same area had an abscess last year which was treated by patient's dermatologist. Patient was not able to see dermatologist today. Denies extremity weakness/paresthesias. Denying any other symptoms.  HPI     Home Medications Prior to Admission medications   Medication Sig Start Date End Date Taking? Authorizing Provider  lidocaine (XYLOCAINE) 5 % ointment Apply 1 Application topically 4 (four) times daily as needed for up to 5 days. 12/01/23 12/06/23 Yes Archimedes Harold, Charlotte Sanes F, PA-C  sulfamethoxazole-trimethoprim (BACTRIM DS) 800-160 MG tablet Take 1 tablet by mouth 2 (two) times daily for 7 days. 12/01/23 12/08/23 Yes Valrie Hart F, PA-C  ergocalciferol (VITAMIN D2) 1.25 MG (50000 UT) capsule Take 1 capsule (50,000 Units total) by mouth once a week. 11/10/23     fenofibrate micronized (LOFIBRA) 134 MG capsule Take 1 capsule (134 mg total) by mouth daily for cholesterol. 10/13/23     hydrochlorothiazide (HYDRODIURIL) 12.5 MG tablet Take 1 tablet (12.5 mg total) by mouth daily. 11/10/23     meclizine (ANTIVERT) 25 MG tablet Take 1 tablet (25 mg total) by mouth 3 (three) times daily as needed for dizziness. 09/27/23   Barrett, Horald Chestnut, PA-C  Semaglutide, 2 MG/DOSE, (OZEMPIC, 2 MG/DOSE,) 8 MG/3ML SOPN Inject 2 mg into the skin once a week. 01/01/23     Semaglutide, 2 MG/DOSE, (OZEMPIC, 2 MG/DOSE,) 8 MG/3ML SOPN Inject 2 mg into the skin once a week. 01/05/23     Semaglutide, 2 MG/DOSE, (OZEMPIC, 2 MG/DOSE,) 8 MG/3ML SOPN Inject 2 mg into the skin once a week.  02/06/23     Semaglutide, 2 MG/DOSE, (OZEMPIC, 2 MG/DOSE,) 8 MG/3ML SOPN Inject 2 mg into the skin once a week. 03/08/23     Semaglutide, 2 MG/DOSE, (OZEMPIC, 2 MG/DOSE,) 8 MG/3ML SOPN Inject 2 mg into the skin once a week. 04/09/23     Semaglutide, 2 MG/DOSE, (OZEMPIC, 2 MG/DOSE,) 8 MG/3ML SOPN Inject 2 mg into the skin once a week. 05/15/23     Semaglutide, 2 MG/DOSE, (OZEMPIC, 2 MG/DOSE,) 8 MG/3ML SOPN Inject 2 mg into the skin once a week. 06/16/23     tirzepatide (MOUNJARO) 2.5 MG/0.5ML Pen Inject 2.5 mg into the skin once a week. 10/13/23     tirzepatide (MOUNJARO) 2.5 MG/0.5ML Pen Inject 2.5 mg into the skin once a week. 10/13/23     tirzepatide (MOUNJARO) 5 MG/0.5ML Pen Inject 5 mg into the skin once a week. 08/12/23     tirzepatide (MOUNJARO) 5 MG/0.5ML Pen Inject 5 mg into the skin once a week. 09/08/23     tirzepatide (ZEPBOUND) 2.5 MG/0.5ML Pen Inject 2.5 mg into the skin once a week for 4 weeks. 11/10/23     Vitamin D, Ergocalciferol, (DRISDOL) 1.25 MG (50000 UNIT) CAPS capsule Take 1 capsule (50,000 Units total) by mouth once a week. 04/09/23     Vitamin D, Ergocalciferol, (DRISDOL) 1.25 MG (50000 UNIT) CAPS capsule Take 1 capsule (50,000 Units total) by mouth once a week. 08/12/23         Allergies    Ondansetron  Review of Systems   Review of Systems  Physical Exam Updated Vital Signs BP 129/62 (BP Location: Right Wrist)   Pulse 77   Temp 98.3 F (36.8 C) (Oral)   Resp 16   Ht 5' 10.5" (1.791 m)   Wt (!) 161 kg   SpO2 95%   BMI 50.22 kg/m  Physical Exam  ED Results / Procedures / Treatments   Labs (all labs ordered are listed, but only abnormal results are displayed) Labs Reviewed - No data to display  EKG None  Radiology No results found.  Procedures Procedures  {Document cardiac monitor, telemetry assessment procedure when appropriate:1}  Medications Ordered in ED Medications  sulfamethoxazole-trimethoprim (BACTRIM DS) 800-160 MG per tablet 1 tablet (has  no administration in time range)    ED Course/ Medical Decision Making/ A&P Clinical Course as of 12/01/23 1458  Sat Dec 01, 2023  1333 Patient not in room  [SM]    Clinical Course User Index [SM] Dorthy Cooler, PA-C   {   Click here for ABCD2, HEART and other calculatorsREFRESH Note before signing :1}                              Medical Decision Making Risk Prescription drug management.   *** Abscess is approx 1cmx1.5cm  {Document critical care time when appropriate:1} {Document review of labs and clinical decision tools ie heart score, Chads2Vasc2 etc:1}  {Document your independent review of radiology images, and any outside records:1} {Document your discussion with family members, caretakers, and with consultants:1} {Document social determinants of health affecting pt's care:1} {Document your decision making why or why not admission, treatments were needed:1} Final Clinical Impression(s) / ED Diagnoses Final diagnoses:  Abscess    Rx / DC Orders ED Discharge Orders          Ordered    sulfamethoxazole-trimethoprim (BACTRIM DS) 800-160 MG tablet  2 times daily        12/01/23 1456    lidocaine (XYLOCAINE) 5 % ointment  4 times daily PRN        12/01/23 1457

## 2023-12-01 NOTE — Discharge Instructions (Addendum)
It was a pleasure caring for you today.  I have sent your antibiotic prescription to your pharmacy.  I also sent stronger prescription for topical lidocaine. Please follow-up with your primary care provider.  Please also follow up with your dermatologist. Seek emergency care if experience any new or worsening symptoms.   Alternating between 650 mg Tylenol and 400 mg Advil: The best way to alternate taking Acetaminophen (example Tylenol) and Ibuprofen (example Advil/Motrin) is to take them 3 hours apart. For example, if you take ibuprofen at 6 am you can then take Tylenol at 9 am. You can continue this regimen throughout the day, making sure you do not exceed the recommended maximum dose for each drug.

## 2023-12-01 NOTE — ED Triage Notes (Signed)
Patient with hx DM2 presents from [facility] for further eval of recurrent abscess to posterior neck, present x 2 days and increasing in size. No drainage or open wound at present. Denies fevers or chills.

## 2023-12-03 ENCOUNTER — Other Ambulatory Visit (HOSPITAL_COMMUNITY): Payer: Self-pay

## 2023-12-03 ENCOUNTER — Other Ambulatory Visit: Payer: Self-pay

## 2023-12-03 MED ORDER — DOXYCYCLINE HYCLATE 100 MG PO CAPS
100.0000 mg | ORAL_CAPSULE | Freq: Two times a day (BID) | ORAL | 0 refills | Status: DC
Start: 2023-12-03 — End: 2024-02-05
  Filled 2023-12-03: qty 28, 14d supply, fill #0

## 2024-01-10 ENCOUNTER — Other Ambulatory Visit (HOSPITAL_COMMUNITY): Payer: Self-pay

## 2024-02-05 ENCOUNTER — Other Ambulatory Visit (HOSPITAL_COMMUNITY): Payer: Self-pay

## 2024-02-05 MED ORDER — DOXYCYCLINE HYCLATE 100 MG PO CAPS
100.0000 mg | ORAL_CAPSULE | Freq: Two times a day (BID) | ORAL | 0 refills | Status: AC
  Filled 2024-02-05: qty 28, 14d supply, fill #0

## 2024-12-02 ENCOUNTER — Other Ambulatory Visit (HOSPITAL_BASED_OUTPATIENT_CLINIC_OR_DEPARTMENT_OTHER): Payer: Self-pay
